# Patient Record
Sex: Male | Born: 1955 | Race: White | Hispanic: No | Marital: Married
Health system: Southern US, Community
[De-identification: ages and names within clinical notes are randomized; demographics above are authoritative.]

---

## 1997-11-25 ENCOUNTER — Ambulatory Visit (HOSPITAL_BASED_OUTPATIENT_CLINIC_OR_DEPARTMENT_OTHER): Admission: RE | Admit: 1997-11-25 | Discharge: 1997-11-25 | Payer: Self-pay | Admitting: Orthopedic Surgery

## 1998-12-06 ENCOUNTER — Encounter: Payer: Self-pay | Admitting: Neurosurgery

## 1998-12-06 ENCOUNTER — Ambulatory Visit (HOSPITAL_COMMUNITY): Admission: RE | Admit: 1998-12-06 | Discharge: 1998-12-06 | Payer: Self-pay | Admitting: Neurosurgery

## 1999-01-17 ENCOUNTER — Encounter: Payer: Self-pay | Admitting: Neurosurgery

## 1999-01-19 ENCOUNTER — Encounter: Payer: Self-pay | Admitting: Neurosurgery

## 1999-01-19 ENCOUNTER — Inpatient Hospital Stay (HOSPITAL_COMMUNITY): Admission: RE | Admit: 1999-01-19 | Discharge: 1999-01-21 | Payer: Self-pay | Admitting: Neurosurgery

## 1999-06-04 ENCOUNTER — Encounter: Payer: Self-pay | Admitting: Neurosurgery

## 1999-06-04 ENCOUNTER — Encounter: Admission: RE | Admit: 1999-06-04 | Discharge: 1999-06-04 | Payer: Self-pay | Admitting: Neurosurgery

## 2000-02-14 ENCOUNTER — Encounter: Admission: RE | Admit: 2000-02-14 | Discharge: 2000-02-14 | Payer: Self-pay | Admitting: Neurosurgery

## 2000-02-14 ENCOUNTER — Encounter: Payer: Self-pay | Admitting: Neurosurgery

## 2000-05-19 ENCOUNTER — Encounter: Admission: RE | Admit: 2000-05-19 | Discharge: 2000-05-19 | Payer: Self-pay | Admitting: Neurosurgery

## 2000-05-19 ENCOUNTER — Encounter: Payer: Self-pay | Admitting: Neurosurgery

## 2000-06-30 ENCOUNTER — Encounter: Payer: Self-pay | Admitting: Orthopaedic Surgery

## 2000-07-03 ENCOUNTER — Inpatient Hospital Stay (HOSPITAL_COMMUNITY): Admission: RE | Admit: 2000-07-03 | Discharge: 2000-07-05 | Payer: Self-pay | Admitting: Orthopaedic Surgery

## 2000-08-21 ENCOUNTER — Encounter: Admission: RE | Admit: 2000-08-21 | Discharge: 2000-08-21 | Payer: Self-pay | Admitting: Neurosurgery

## 2000-08-21 ENCOUNTER — Encounter: Payer: Self-pay | Admitting: Neurosurgery

## 2001-05-01 ENCOUNTER — Encounter: Admission: RE | Admit: 2001-05-01 | Discharge: 2001-05-01 | Payer: Self-pay | Admitting: Internal Medicine

## 2001-05-01 ENCOUNTER — Encounter: Payer: Self-pay | Admitting: Internal Medicine

## 2001-05-20 ENCOUNTER — Encounter: Payer: Self-pay | Admitting: Neurosurgery

## 2001-05-20 ENCOUNTER — Encounter: Admission: RE | Admit: 2001-05-20 | Discharge: 2001-05-20 | Payer: Self-pay | Admitting: Neurosurgery

## 2001-06-03 ENCOUNTER — Encounter: Admission: RE | Admit: 2001-06-03 | Discharge: 2001-06-03 | Payer: Self-pay | Admitting: Neurosurgery

## 2001-06-03 ENCOUNTER — Encounter: Payer: Self-pay | Admitting: Neurosurgery

## 2001-06-16 ENCOUNTER — Encounter: Admission: RE | Admit: 2001-06-16 | Discharge: 2001-06-16 | Payer: Self-pay | Admitting: Neurosurgery

## 2001-06-16 ENCOUNTER — Encounter: Payer: Self-pay | Admitting: Neurosurgery

## 2002-12-10 ENCOUNTER — Encounter: Admission: RE | Admit: 2002-12-10 | Discharge: 2002-12-10 | Payer: Self-pay | Admitting: Neurosurgery

## 2002-12-10 ENCOUNTER — Encounter: Payer: Self-pay | Admitting: Neurosurgery

## 2002-12-10 ENCOUNTER — Encounter: Payer: Self-pay | Admitting: Radiology

## 2002-12-24 ENCOUNTER — Encounter: Payer: Self-pay | Admitting: Neurosurgery

## 2002-12-24 ENCOUNTER — Encounter: Admission: RE | Admit: 2002-12-24 | Discharge: 2002-12-24 | Payer: Self-pay | Admitting: Neurosurgery

## 2003-01-13 ENCOUNTER — Encounter: Payer: Self-pay | Admitting: Neurosurgery

## 2003-01-13 ENCOUNTER — Encounter: Admission: RE | Admit: 2003-01-13 | Discharge: 2003-01-13 | Payer: Self-pay | Admitting: Neurosurgery

## 2003-04-27 ENCOUNTER — Encounter: Payer: Self-pay | Admitting: Neurosurgery

## 2003-04-29 ENCOUNTER — Encounter: Payer: Self-pay | Admitting: Neurosurgery

## 2003-04-29 ENCOUNTER — Inpatient Hospital Stay (HOSPITAL_COMMUNITY): Admission: RE | Admit: 2003-04-29 | Discharge: 2003-05-02 | Payer: Self-pay | Admitting: Neurosurgery

## 2003-05-12 ENCOUNTER — Ambulatory Visit (HOSPITAL_COMMUNITY): Admission: RE | Admit: 2003-05-12 | Discharge: 2003-05-12 | Payer: Self-pay | Admitting: Neurosurgery

## 2004-04-09 ENCOUNTER — Encounter: Admission: RE | Admit: 2004-04-09 | Discharge: 2004-04-09 | Payer: Self-pay | Admitting: Internal Medicine

## 2004-07-10 ENCOUNTER — Inpatient Hospital Stay (HOSPITAL_COMMUNITY): Admission: RE | Admit: 2004-07-10 | Discharge: 2004-07-13 | Payer: Self-pay | Admitting: Orthopaedic Surgery

## 2005-01-21 IMAGING — CT CT CHEST W/ CM
1 of 2 series · 14 of 30 positions shown, 18 images · IV contrast (75CC OMNI 300)
Comparison: none

CLINICAL DATA: Hoarseness.  Former smoker.  
 CT CHEST WITH CONTRAST 
 Multidetector helical scans through the chest were performed after IV contrast media was given.  75 cc of Omnipaque 300 were given as the contrast media.  There is no evidence of mediastinal or hilar adenopathy.  The pulmonary arteries and thoracic aorta opacify normally.  On lung window images there are a few blebs in the right apex.  However, no lung parenchymal lesion is seen.  No effusion is noted.   
 IMPRESSION
 Negative CT of the chest.  No mediastinal or hilar adenopathy is seen.

[Series 2: routine chest · axial · 0.74mm/px · z∈[-311,+14]mm · 14 of 77 slices shown, 18 images]
[im 6/77  mediastinal]
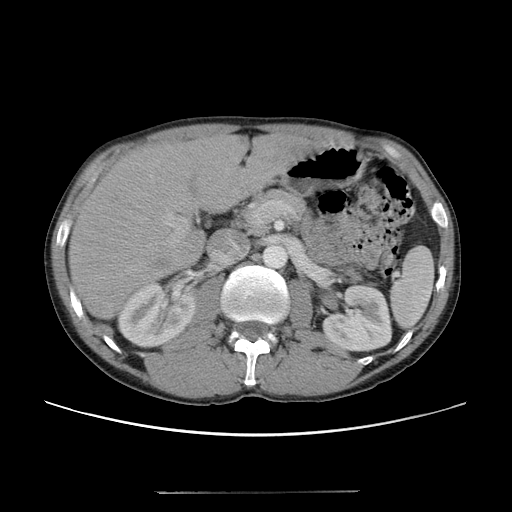
[im 6/77  lung]
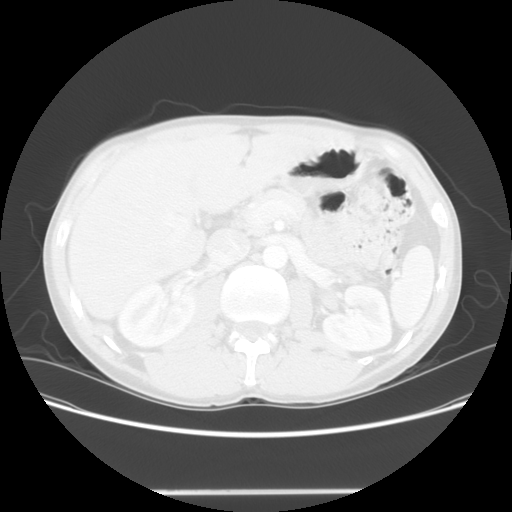
[im 11/77  lung]
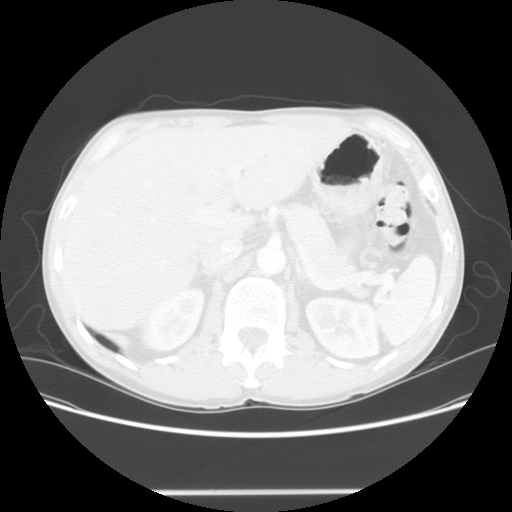
[im 17/77  lung]
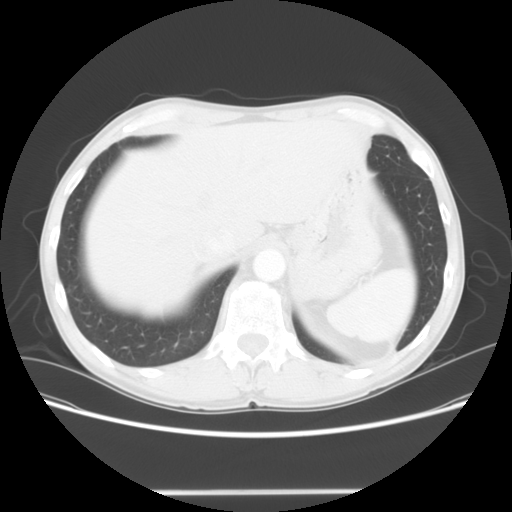
[im 22/77  lung]
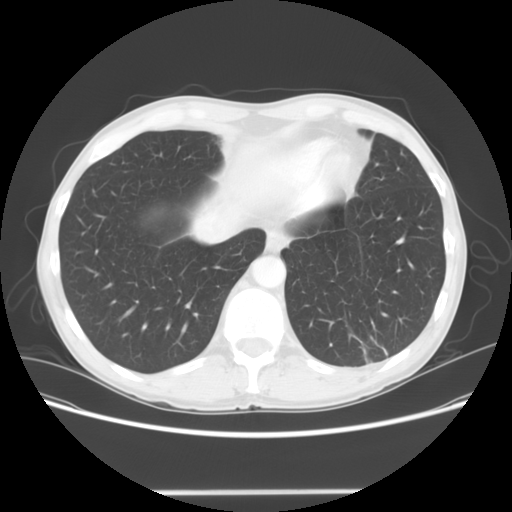
[im 28/77  mediastinal]
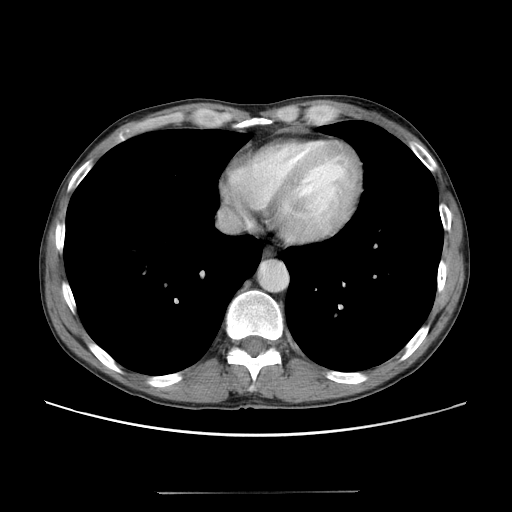
[im 28/77  lung]
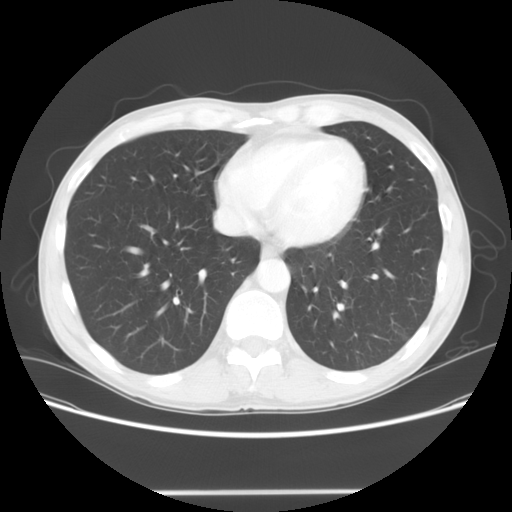
[im 33/77  lung]
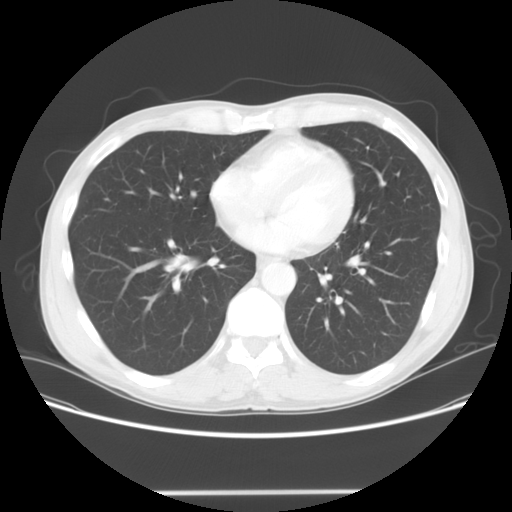
[im 38/77  lung]
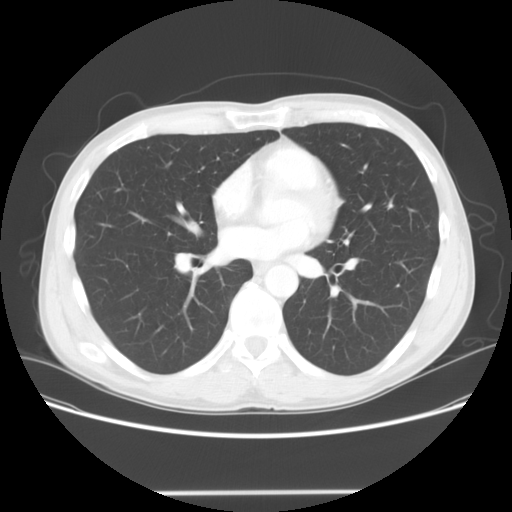
[im 39/77  lung]
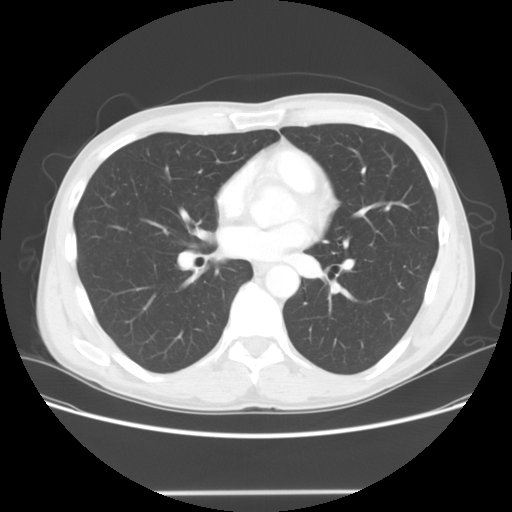
[im 44/77  mediastinal]
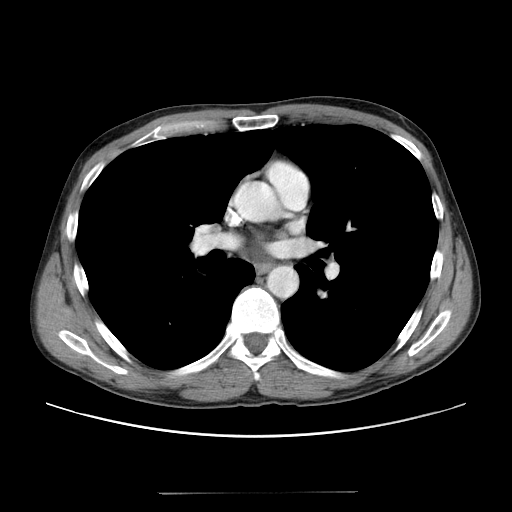
[im 44/77  lung]
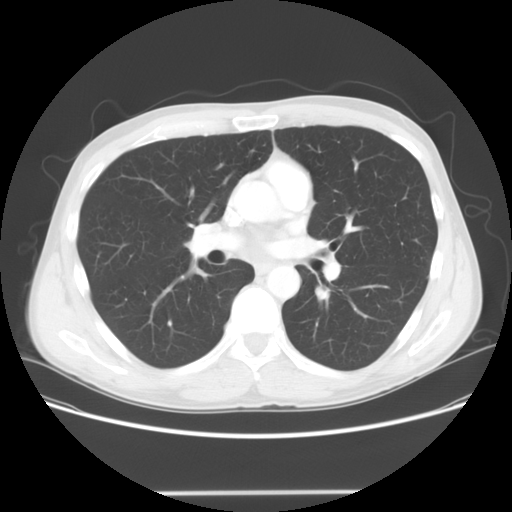
[im 49/77  lung]
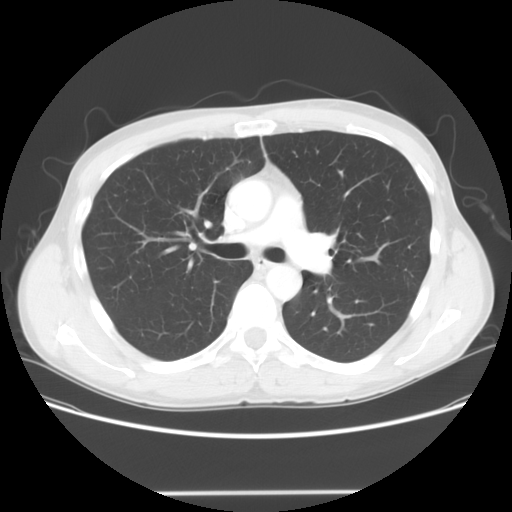
[im 55/77  lung]
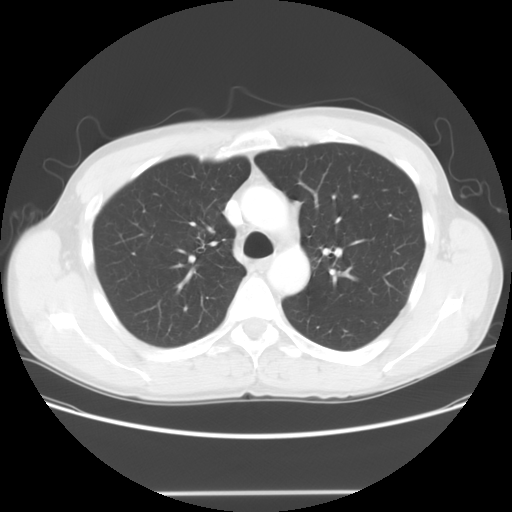
[im 60/77  lung]
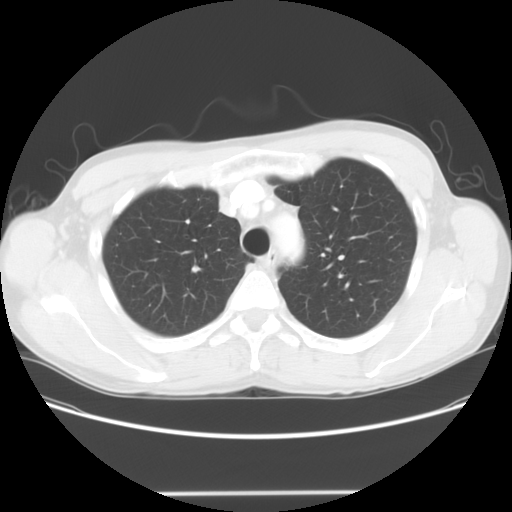
[im 66/77  mediastinal]
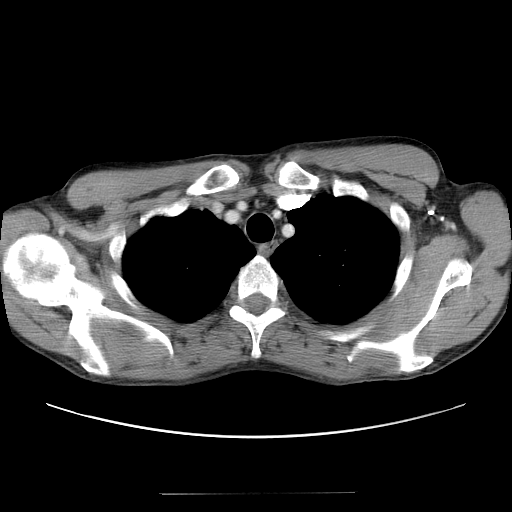
[im 66/77  lung]
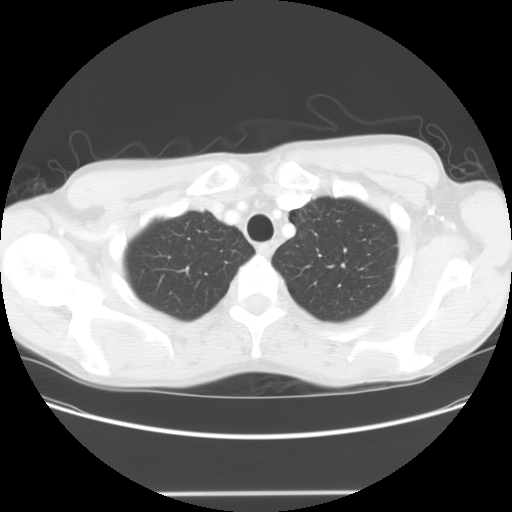
[im 71/77  lung]
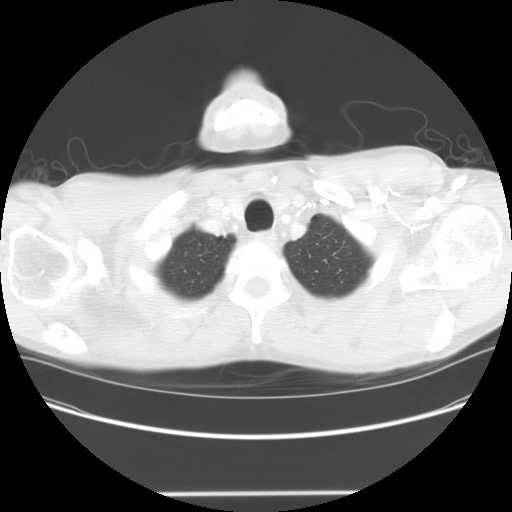

[14 of 30 positions shown; findings below may reference images not displayed]

## 2006-07-28 ENCOUNTER — Ambulatory Visit (HOSPITAL_COMMUNITY): Admission: RE | Admit: 2006-07-28 | Discharge: 2006-07-28 | Payer: Self-pay | Admitting: Neurosurgery

## 2019-10-07 ENCOUNTER — Ambulatory Visit: Payer: Self-pay

## 2022-06-25 ENCOUNTER — Ambulatory Visit: Payer: Self-pay

## 2022-06-25 ENCOUNTER — Ambulatory Visit (INDEPENDENT_AMBULATORY_CARE_PROVIDER_SITE_OTHER): Payer: Medicare PPO | Admitting: Orthopaedic Surgery

## 2022-06-25 ENCOUNTER — Encounter: Payer: Self-pay | Admitting: Orthopaedic Surgery

## 2022-06-25 ENCOUNTER — Ambulatory Visit (INDEPENDENT_AMBULATORY_CARE_PROVIDER_SITE_OTHER): Payer: Medicare PPO

## 2022-06-25 DIAGNOSIS — Z96653 Presence of artificial knee joint, bilateral: Secondary | ICD-10-CM | POA: Diagnosis not present

## 2022-06-25 DIAGNOSIS — G8929 Other chronic pain: Secondary | ICD-10-CM

## 2022-06-25 DIAGNOSIS — M25561 Pain in right knee: Secondary | ICD-10-CM

## 2022-06-25 DIAGNOSIS — M25562 Pain in left knee: Secondary | ICD-10-CM | POA: Diagnosis not present

## 2022-06-25 DIAGNOSIS — M0579 Rheumatoid arthritis with rheumatoid factor of multiple sites without organ or systems involvement: Secondary | ICD-10-CM

## 2022-06-25 DIAGNOSIS — T8484XA Pain due to internal orthopedic prosthetic devices, implants and grafts, initial encounter: Secondary | ICD-10-CM | POA: Diagnosis not present

## 2022-06-25 MED ORDER — LIDOCAINE HCL 1 % IJ SOLN
2.0000 mL | INTRAMUSCULAR | Status: AC | PRN
  Administered 2022-06-25: 2 mL

## 2022-06-25 NOTE — Progress Notes (Signed)
Office Visit Note   Patient: Joseph Schroeder           Date of Birth: 15-Mar-1956           MRN: [id number] Visit Date: 06/25/2022              Requested by: No referring provider defined for this encounter. PCP: No primary care provider on file.   Assessment & Plan: Visit Diagnoses:  1. Hx of total knee replacement, bilateral   2. Chronic pain of left knee   3. Chronic pain of right knee   4. Chronic pain of both knees   5. Pain of both lower extremities due to bilateral total knee replacements, initial encounter (HCC)   6. Rheumatoid arthritis with rheumatoid factor of multiple sites without organ or systems involvement [facility])     Plan: Mr. Joseph Schroeder has a long history of rheumatoid arthritis being followed by one of the rheumatologists.  He recently changed his toyed medicines to which she was having more trouble with his knees.  He was seen at [facility] and was told that he might need to have his knees revised to change some of the "parts".  I performed his knee replacement surgeries years ago i.e. 20 to 25 years.  He wanted another opinion.  He denies any fever or chills.  Does not have any history of injury or trauma.  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  It is mostly along the anterior aspect of his knee and seems to be more than prevalent the more he is on his feet.  He thinks he has had some swelling on the left.  His x-rays demonstrate good position of the components without obvious loosening but I will order a three-phase bone scan.  I aspirated his left knee and there was a little bit cloudy fluid and we will send this off for cell cell count and culture and sensitivity.  Discussion regarding possibilities.  I suspect his problems going to be weakness and that with a course of therapy or exercises he might be just fine but we will wait for the bone scan results.  He is not having any hip pain  Follow-Up Instructions: Return After three-phase  bone scan both knees.   Orders:  Orders Placed This Encounter  Procedures   Anaerobic and Aerobic Culture   XR KNEE 3 VIEW RIGHT   XR KNEE 3 VIEW LEFT   NM Bone Scan 3 Phase   Cell Count + Diff, w/o Cryst, Synvl Fld.   No orders of the defined types were placed in this encounter.     Procedures: Large Joint Inj: L knee on 06/25/2022 5:46 PM Indications: pain and diagnostic evaluation Details: 25 G 1.5 in needle, anteromedial approach  Arthrogram: No  Medications: 2 mL lidocaine 1 % Aspirate: 35 mL yellow and cloudy; sent for lab analysis  Minimal cloudiness of the joint fluid.  No obvious purulence Procedure, treatment alternatives, risks and benefits explained, specific risks discussed. Consent was given by the patient. Patient was prepped and draped in the usual sterile fashion.       Clinical Data: No additional findings.   Subjective: Chief Complaint  Patient presents with   Right Knee - Pain   Left Knee - Pain  Still Joseph Schroeder has a long history of rheumatoid arthritis and is presently being followed by one of the rheumatologist.  He recently switched his rheumatoid medicines and prior to  which she notes he was having more pain in his knees.  He was evaluated at [facility] for his knee pain and he was told he might need to have some of the "parts revised".  I performed his initial knee replacement surgeries and he came to the office for further evaluation.  He has not had any fever or chills.  Pain is fairly well localized along the anterior aspect of his knees.  HPI  Review of Systems   Objective: Vital Signs: There were no vitals taken for this visit.  Physical Exam Constitutional:      Appearance: He is well-developed.  Eyes:     Pupils: Pupils are equal, round, and reactive to light.  Pulmonary:     Effort: Pulmonary effort is normal.  Skin:    General: Skin is warm and dry.  Neurological:     Mental Status: He is alert and oriented to person, place, and  time.  Psychiatric:        Behavior: Behavior normal.     Ortho Exam left knee was minimally warm and had a small effusion.  Pain along the medial or lateral aspect of the femur or the tibia.  No skin changes.  Neurologically intact.  Full quick extension and flexion well over 90 degrees.  No evidence of instability.  No popliteal pain.  No calf pain.  Painless range of motion of both hips.  Right knee was not effused.  No localized areas of tenderness.  Patella was stable and no varus or valgus instability.  No hip pain.  Specialty Comments:  No specialty comments available.  Imaging: XR KNEE 3 VIEW RIGHT  Result Date: 06/25/2022 Films of the right knee also demonstrate an intact total knee replacement.  No obvious loosening.  Good glue mantle.  Some mild ectopic calcification at the proximal patella without any obvious complication of the patella component.  Patient is not tender in that region.  No acute changes  XR KNEE 3 VIEW LEFT  Result Date: 06/25/2022 Of the left knee replacement demonstrated an intact total knee replacement.  There is no obvious loosening.  The joint space is a little more narrow compared to the opposite side but symmetrically both medially and laterally.  Not sure this is related to polyethylene wear.  Acute change.  Good glue mantle ectopic calcification at the proximal tip of the patella that appears to be old.  No history of injury or trauma.    PMFS History: Patient Active Problem List   Diagnosis Date Noted   Bilateral knee pain 06/25/2022   Pain of both lower extremities due to bilateral total knee replacements (HCC) 06/25/2022   Rheumatoid arthritis with rheumatoid factor of multiple sites without organ or systems involvement (HCC) 06/25/2022   History reviewed. No pertinent past medical history.  History reviewed. No pertinent family history.  History reviewed. No pertinent surgical history. Social History   Occupational History   Not on file   Tobacco Use   Smoking status: Not on file   Smokeless tobacco: Not on file  Substance and Sexual Activity   Alcohol use: Not on file   Drug use: Not on file   Sexual activity: Not on file     Valeria Batman, MD   Note - This record has been created using [employer].  Chart creation errors have been sought, but may not always  have been located. Such creation errors do not reflect on  the standard of medical care.

## 2022-07-02 LAB — ANAEROBIC AND AEROBIC CULTURE
AER RESULT:: NO GROWTH
MICRO NUMBER:: 14272500
MICRO NUMBER:: 14272501
SPECIMEN QUALITY:: ADEQUATE
SPECIMEN QUALITY:: ADEQUATE

## 2022-07-02 LAB — CELL COUNT + DIFF, W/O CRYST-SYNVL FLD
Basophils, %: 0 %
Eosinophils-Synovial: 0 % (ref 0–2)
Lymphocytes-Synovial Fld: 20 % (ref 0–74)
Monocyte/Macrophage: 0 % (ref 0–69)
Neutrophil, Synovial: 70 % — ABNORMAL HIGH (ref 0–24)
Synoviocytes, %: 10 % (ref 0–15)
WBC, Synovial: 1389 cells/uL — ABNORMAL HIGH (ref <150)
